# Patient Record
Sex: Female | Born: 1965 | Hispanic: Yes | Marital: Married | Smoking: Former smoker
Health system: Southern US, Community
[De-identification: ages and names within clinical notes are randomized; demographics above are authoritative.]

## PROBLEM LIST (undated history)

## (undated) DIAGNOSIS — N649 Disorder of breast, unspecified: Secondary | ICD-10-CM

## (undated) DIAGNOSIS — K219 Gastro-esophageal reflux disease without esophagitis: Secondary | ICD-10-CM

## (undated) DIAGNOSIS — K759 Inflammatory liver disease, unspecified: Secondary | ICD-10-CM

## (undated) HISTORY — PX: TUBAL LIGATION: SHX77

---

## 2009-06-29 ENCOUNTER — Ambulatory Visit: Payer: Self-pay | Admitting: Family Medicine

## 2018-06-05 ENCOUNTER — Other Ambulatory Visit (HOSPITAL_COMMUNITY): Payer: Self-pay | Admitting: *Deleted

## 2018-06-05 DIAGNOSIS — N644 Mastodynia: Secondary | ICD-10-CM

## 2018-07-14 ENCOUNTER — Ambulatory Visit
Admission: RE | Admit: 2018-07-14 | Discharge: 2018-07-14 | Disposition: A | Discharge status: Home / Self Care | Payer: No Typology Code available for payment source | Source: Ambulatory Visit | Attending: Obstetrics and Gynecology | Admitting: Obstetrics and Gynecology

## 2018-07-14 ENCOUNTER — Other Ambulatory Visit (HOSPITAL_COMMUNITY): Payer: Self-pay | Admitting: Obstetrics and Gynecology

## 2018-07-14 ENCOUNTER — Encounter (HOSPITAL_COMMUNITY): Payer: Self-pay

## 2018-07-14 ENCOUNTER — Ambulatory Visit
Admission: RE | Admit: 2018-07-14 | Discharge: 2018-07-14 | Disposition: A | Discharge status: Home / Self Care | Payer: Self-pay | Source: Ambulatory Visit | Attending: Obstetrics and Gynecology | Admitting: Obstetrics and Gynecology

## 2018-07-14 ENCOUNTER — Ambulatory Visit (HOSPITAL_COMMUNITY)
Admission: RE | Admit: 2018-07-14 | Discharge: 2018-07-14 | Disposition: A | Discharge status: Home / Self Care | Payer: Self-pay | Source: Ambulatory Visit | Attending: Obstetrics and Gynecology | Admitting: Obstetrics and Gynecology

## 2018-07-14 VITALS — BP 122/78 | Wt 164.0 lb

## 2018-07-14 DIAGNOSIS — N644 Mastodynia: Secondary | ICD-10-CM

## 2018-07-14 DIAGNOSIS — N6310 Unspecified lump in the right breast, unspecified quadrant: Secondary | ICD-10-CM

## 2018-07-14 DIAGNOSIS — Z1239 Encounter for other screening for malignant neoplasm of breast: Secondary | ICD-10-CM

## 2018-07-14 NOTE — Progress Notes (Signed)
Complaints of left breast pain since October 2019 that comes and goes. Patient rates the pain at a 7   Pap Smear: Pap smear not completed today. Last Pap smear was on October 2019 at the [facility] Department and normal per patient. Per patient has no history of an abnormal Pap smear. No Pap smear results are in Epic.  Physical exam: Breasts Breasts symmetrical. No skin abnormalities bilateral breasts. No nipple retraction bilateral breasts. No nipple discharge bilateral breasts. No lymphadenopathy. No lumps palpated bilateral breasts. Complaints of left breast pain at 3 o'clock next to areola on exam. Referred patient to the [facility] Center of [facility] for a diagnostic mammogram. Appointment scheduled for Tuesday, July 14, 2018 at 1430.        Pelvic/Bimanual No Pap smear completed today since last Pap smear was in October 2019 per patient. Pap smear not indicated per BCCCP guidelines.   Smoking History: Patient has never smoked.  Patient Navigation: Patient education provided. Access to services provided for patient through [facility] program. Spanish interpreter provided.   Colorectal Cancer Screening: Per patient has never had a colonoscopy completed. No complaints today. FIT Test given to patient to complete and return to BCCCP.  Breast and Cervical Cancer Risk Assessment: Patient has no family history of breast cancer, known genetic mutations, or radiation treatment to the chest before age 5. Patient has no history of cervical dysplasia, immunocompromised, or DES exposure in-utero.  Risk Assessment    Risk Scores      07/14/2018   Last edited by: Lynnell Dike, LPN   5-year risk: 0.5 %   Lifetime risk: 4.5 %         Used Spanish interpreter Natale Lay from [place].

## 2018-07-14 NOTE — Patient Instructions (Signed)
Explained breast self awareness with Chloe Payne. Patient did not need a Pap smear today due to last Pap smear was in October 2019 per patient. Let her know B[facility]CP will cover Pap smears every 3 years unless has a history of abnormal Pap smears. Referred patient to the [facility] for a diagnostic mammogram. Appointment scheduled for Tuesday, July 14, 2018 at 1430. Patient aware of appointment and will be there. [name] verbalized understanding.  Melisia Leming, Kathaleen Maserhristine Poll, RN 4:04 PM

## 2018-07-15 ENCOUNTER — Encounter (HOSPITAL_COMMUNITY): Payer: Self-pay | Admitting: *Deleted

## 2018-07-17 ENCOUNTER — Ambulatory Visit
Admission: RE | Admit: 2018-07-17 | Discharge: 2018-07-17 | Disposition: A | Discharge status: Home / Self Care | Payer: No Typology Code available for payment source | Source: Ambulatory Visit | Attending: Obstetrics and Gynecology | Admitting: Obstetrics and Gynecology

## 2018-07-17 DIAGNOSIS — N6310 Unspecified lump in the right breast, unspecified quadrant: Secondary | ICD-10-CM

## 2018-07-21 ENCOUNTER — Encounter (HOSPITAL_COMMUNITY): Payer: Self-pay | Admitting: *Deleted

## 2018-07-29 ENCOUNTER — Other Ambulatory Visit: Payer: Self-pay | Admitting: General Surgery

## 2018-07-29 ENCOUNTER — Other Ambulatory Visit: Payer: Self-pay

## 2018-07-29 ENCOUNTER — Ambulatory Visit: Payer: Self-pay | Admitting: General Surgery

## 2018-07-29 DIAGNOSIS — N6021 Fibroadenosis of right breast: Secondary | ICD-10-CM

## 2018-08-12 NOTE — Pre-Procedure Instructions (Signed)
Chloe Payne  08/12/2018      [facility] #[id number] Gin[place] Otto,  - [address] AT [facility] & [address] [place] [address] Phone: [phone] Fax: [phone]    Your procedure is scheduled on Wednesday February 26th.  Report to [facility] Admitting at 10:00 A.M.  Call this number if you have problems the morning of surgery:  [phone]   Remember:  Do not eat after midnight. You may drink clear liquids until 9:00 AM. Clear liquids include water, non-citrus juices without pulp, carbonated  beverages, clear tea, black coffee and gatorade.      Take these medicines the morning of surgery with A SIP OF WATER - NONE  7 days prior to surgery STOP taking any Aspirin(unless otherwise instructed by your surgeon), Aleve, Naproxen, Ibuprofen, Motrin, Advil, Goody's, BC's, all herbal medications, fish oil, and all vitamins     Do not wear jewelry, make-up or nail polish.  Do not wear lotions, powders, or perfumes, or deodorant.  Do not shave 48 hours prior to surgery.    Do not bring valuables to the hospital.  [facility] is not responsible for any belongings or valuables.  Contacts, dentures or bridgework may not be worn into surgery.  Leave your suitcase in the car.  After surgery it may be brought to your room.  For patients admitted to the hospital, discharge time will be determined by your treatment team.  Patients discharged the day of surgery will not be allowed to drive home.   [facility]- Preparing For Surgery  Before surgery, you can play an important role. Because skin is not sterile, your skin needs to be as free of germs as possible. You can reduce the number of germs on your skin by washing with CHG (chlorahexidine gluconate) Soap before surgery.  CHG is an antiseptic cleaner which kills germs and bonds with the skin to continue killing germs even after washing.    Oral Hygiene is  also important to reduce your risk of infection.  Remember - BRUSH YOUR TEETH THE MORNING OF SURGERY WITH YOUR REGULAR TOOTHPASTE  Please do not use if you have an allergy to CHG or antibacterial soaps. If your skin becomes reddened/irritated stop using the CHG.  Do not shave (including legs and underarms) for at least 48 hours prior to first CHG shower. It is OK to shave your face.  Please follow these instructions carefully.   1. Shower the NIGHT BEFORE SURGERY and the MORNING OF SURGERY with CHG.   2. If you chose to wash your hair, wash your hair first as usual with your normal shampoo.  3. After you shampoo, rinse your hair and body thoroughly to remove the shampoo.  4. Use CHG as you would any other liquid soap. You can apply CHG directly to the skin and wash gently with a scrungie or a clean washcloth.   5. Apply the CHG Soap to your body ONLY FROM THE NECK DOWN.  Do not use on open wounds or open sores. Avoid contact with your eyes, ears, mouth and genitals (private parts). Wash Face and genitals (private parts)  with your normal soap.  6. Wash thoroughly, paying special attention to the area where your surgery will be performed.  7. Thoroughly rinse your body with warm water from the neck down.  8. DO NOT shower/wash with your normal soap after using and rinsing off the CHG  Soap.  9. Pat yourself dry with a CLEAN TOWEL.  10. Wear CLEAN PAJAMAS to bed the night before surgery, wear comfortable clothes the morning of surgery  11. Place CLEAN SHEETS on your bed the night of your first shower and DO NOT SLEEP WITH PETS.    Day of Surgery: Shower as stated above. Do not apply any deodorants/lotions.  Please wear clean clothes to the hospital/surgery center.   Remember to brush your teeth WITH YOUR REGULAR TOOTHPASTE.  Please read over the following fact sheets that you were given.

## 2018-08-13 ENCOUNTER — Encounter (HOSPITAL_COMMUNITY)
Admission: RE | Admit: 2018-08-13 | Discharge: 2018-08-13 | Disposition: A | Discharge status: Home / Self Care | Payer: No Typology Code available for payment source | Source: Ambulatory Visit | Attending: General Surgery | Admitting: General Surgery

## 2018-08-13 ENCOUNTER — Encounter (HOSPITAL_COMMUNITY): Payer: Self-pay

## 2018-08-13 ENCOUNTER — Other Ambulatory Visit: Payer: Self-pay

## 2018-08-13 DIAGNOSIS — Z01812 Encounter for preprocedural laboratory examination: Secondary | ICD-10-CM | POA: Insufficient documentation

## 2018-08-13 HISTORY — DX: Disorder of breast, unspecified: N64.9

## 2018-08-13 HISTORY — DX: Inflammatory liver disease, unspecified: K75.9

## 2018-08-13 HISTORY — DX: Gastro-esophageal reflux disease without esophagitis: K21.9

## 2018-08-13 LAB — CBC
HCT: 40.7 % (ref 36.0–46.0)
Hemoglobin: 13.7 g/dL (ref 12.0–15.0)
MCH: 29.2 pg (ref 26.0–34.0)
MCHC: 33.7 g/dL (ref 30.0–36.0)
MCV: 86.8 fL (ref 80.0–100.0)
Platelets: 421 10*3/uL — ABNORMAL HIGH (ref 150–400)
RBC: 4.69 MIL/uL (ref 3.87–5.11)
RDW: 12.4 % (ref 11.5–15.5)
WBC: 7 10*3/uL (ref 4.0–10.5)
nRBC: 0.6 % — ABNORMAL HIGH (ref 0.0–0.2)

## 2018-08-13 LAB — FECAL OCCULT BLOOD, IMMUNOCHEMICAL: Fecal Occult Bld: NEGATIVE

## 2018-08-13 NOTE — Progress Notes (Signed)
Pt accompanied by daughters and Spanish Interpreter at [employer] appointment. Pt denies SOB, chest pain, and being under the care of a cardiologist. Pt denies having a stress test, echo and cardiac cath. Pt denies having an EKG and chest x ray. Pt denies recent labs. Pt verbalized understanding of all pre-op instructions.

## 2018-08-17 ENCOUNTER — Ambulatory Visit
Admission: RE | Admit: 2018-08-17 | Discharge: 2018-08-17 | Disposition: A | Discharge status: Home / Self Care | Payer: No Typology Code available for payment source | Source: Ambulatory Visit | Attending: General Surgery | Admitting: General Surgery

## 2018-08-17 DIAGNOSIS — N6021 Fibroadenosis of right breast: Secondary | ICD-10-CM

## 2018-08-19 ENCOUNTER — Ambulatory Visit (HOSPITAL_COMMUNITY)
Admission: RE | Admit: 2018-08-19 | Discharge: 2018-08-19 | Disposition: A | Discharge status: Home / Self Care | Payer: Self-pay | Attending: General Surgery | Admitting: General Surgery

## 2018-08-19 ENCOUNTER — Encounter (HOSPITAL_COMMUNITY)
Admission: RE | Disposition: A | Discharge status: Home / Self Care | Payer: Self-pay | Source: Home / Self Care | Attending: General Surgery

## 2018-08-19 ENCOUNTER — Encounter (HOSPITAL_COMMUNITY): Payer: Self-pay | Admitting: *Deleted

## 2018-08-19 ENCOUNTER — Encounter (HOSPITAL_COMMUNITY): Payer: Self-pay | Admitting: Surgery

## 2018-08-19 ENCOUNTER — Ambulatory Visit (HOSPITAL_COMMUNITY): Payer: Self-pay | Admitting: Certified Registered"

## 2018-08-19 ENCOUNTER — Ambulatory Visit
Admission: RE | Admit: 2018-08-19 | Discharge: 2018-08-19 | Disposition: A | Discharge status: Home / Self Care | Payer: No Typology Code available for payment source | Source: Ambulatory Visit | Attending: General Surgery | Admitting: General Surgery

## 2018-08-19 ENCOUNTER — Other Ambulatory Visit: Payer: Self-pay

## 2018-08-19 DIAGNOSIS — Z87891 Personal history of nicotine dependence: Secondary | ICD-10-CM | POA: Insufficient documentation

## 2018-08-19 DIAGNOSIS — N6021 Fibroadenosis of right breast: Secondary | ICD-10-CM

## 2018-08-19 DIAGNOSIS — Z8249 Family history of ischemic heart disease and other diseases of the circulatory system: Secondary | ICD-10-CM | POA: Insufficient documentation

## 2018-08-19 DIAGNOSIS — N6011 Diffuse cystic mastopathy of right breast: Secondary | ICD-10-CM | POA: Insufficient documentation

## 2018-08-19 HISTORY — PX: BREAST LUMPECTOMY WITH RADIOACTIVE SEED LOCALIZATION: SHX6424

## 2018-08-19 SURGERY — BREAST LUMPECTOMY WITH RADIOACTIVE SEED LOCALIZATION
Anesthesia: General | Site: Breast | Laterality: Right

## 2018-08-19 MED ORDER — PROPOFOL 10 MG/ML IV BOLUS
INTRAVENOUS | Status: AC
  Filled 2018-08-19: qty 20

## 2018-08-19 MED ORDER — MIDAZOLAM HCL 2 MG/2ML IJ SOLN
INTRAMUSCULAR | Status: AC
  Filled 2018-08-19: qty 2

## 2018-08-19 MED ORDER — FENTANYL CITRATE (PF) 100 MCG/2ML IJ SOLN
25.0000 ug | INTRAMUSCULAR | Status: DC | PRN
  Administered 2018-08-19 (×2): 25 ug via INTRAVENOUS

## 2018-08-19 MED ORDER — FENTANYL CITRATE (PF) 250 MCG/5ML IJ SOLN
INTRAMUSCULAR | Status: DC | PRN
  Administered 2018-08-19: 50 ug via INTRAVENOUS
  Administered 2018-08-19: 25 ug via INTRAVENOUS

## 2018-08-19 MED ORDER — CELECOXIB 200 MG PO CAPS
200.0000 mg | ORAL_CAPSULE | ORAL | Status: AC
  Administered 2018-08-19: 200 mg via ORAL
  Filled 2018-08-19: qty 1

## 2018-08-19 MED ORDER — FENTANYL CITRATE (PF) 250 MCG/5ML IJ SOLN
INTRAMUSCULAR | Status: AC
  Filled 2018-08-19: qty 5

## 2018-08-19 MED ORDER — DEXAMETHASONE SODIUM PHOSPHATE 10 MG/ML IJ SOLN
INTRAMUSCULAR | Status: DC | PRN
  Administered 2018-08-19: 10 mg via INTRAVENOUS

## 2018-08-19 MED ORDER — 0.9 % SODIUM CHLORIDE (POUR BTL) OPTIME
TOPICAL | Status: DC | PRN
  Administered 2018-08-19: 1000 mL

## 2018-08-19 MED ORDER — ONDANSETRON HCL 4 MG/2ML IJ SOLN
INTRAMUSCULAR | Status: AC
  Filled 2018-08-19: qty 2

## 2018-08-19 MED ORDER — HYDROCODONE-ACETAMINOPHEN 5-325 MG PO TABS: 1.0000 | ORAL_TABLET | Freq: Four times a day (QID) | ORAL | 0 refills | Status: DC | PRN

## 2018-08-19 MED ORDER — LACTATED RINGERS IV SOLN
INTRAVENOUS | Status: DC
  Administered 2018-08-19: 11:00:00 via INTRAVENOUS

## 2018-08-19 MED ORDER — PHENYLEPHRINE 40 MCG/ML (10ML) SYRINGE FOR IV PUSH (FOR BLOOD PRESSURE SUPPORT)
PREFILLED_SYRINGE | INTRAVENOUS | Status: DC | PRN
  Administered 2018-08-19 (×2): 40 ug via INTRAVENOUS

## 2018-08-19 MED ORDER — BUPIVACAINE-EPINEPHRINE 0.25% -1:200000 IJ SOLN
INTRAMUSCULAR | Status: DC | PRN
  Administered 2018-08-19: 20 mL

## 2018-08-19 MED ORDER — CHLORHEXIDINE GLUCONATE CLOTH 2 % EX PADS: 6.0000 | MEDICATED_PAD | Freq: Once | CUTANEOUS | Status: DC

## 2018-08-19 MED ORDER — PHENYLEPHRINE 40 MCG/ML (10ML) SYRINGE FOR IV PUSH (FOR BLOOD PRESSURE SUPPORT)
PREFILLED_SYRINGE | INTRAVENOUS | Status: AC
  Filled 2018-08-19: qty 10

## 2018-08-19 MED ORDER — DEXAMETHASONE SODIUM PHOSPHATE 10 MG/ML IJ SOLN
INTRAMUSCULAR | Status: AC
  Filled 2018-08-19: qty 1

## 2018-08-19 MED ORDER — FENTANYL CITRATE (PF) 100 MCG/2ML IJ SOLN
INTRAMUSCULAR | Status: AC
  Filled 2018-08-19: qty 2

## 2018-08-19 MED ORDER — ACETAMINOPHEN 500 MG PO TABS
1000.0000 mg | ORAL_TABLET | ORAL | Status: AC
  Administered 2018-08-19: 1000 mg via ORAL
  Filled 2018-08-19: qty 2

## 2018-08-19 MED ORDER — LIDOCAINE 2% (20 MG/ML) 5 ML SYRINGE
INTRAMUSCULAR | Status: DC | PRN
  Administered 2018-08-19: 60 mg via INTRAVENOUS

## 2018-08-19 MED ORDER — CEFAZOLIN SODIUM-DEXTROSE 2-4 GM/100ML-% IV SOLN
2.0000 g | INTRAVENOUS | Status: AC
  Administered 2018-08-19: 2 g via INTRAVENOUS
  Filled 2018-08-19: qty 100

## 2018-08-19 MED ORDER — PROPOFOL 10 MG/ML IV BOLUS
INTRAVENOUS | Status: DC | PRN
  Administered 2018-08-19: 110 mg via INTRAVENOUS
  Administered 2018-08-19: 20 mg via INTRAVENOUS

## 2018-08-19 MED ORDER — MIDAZOLAM HCL 2 MG/2ML IJ SOLN
INTRAMUSCULAR | Status: DC | PRN
  Administered 2018-08-19: 2 mg via INTRAVENOUS

## 2018-08-19 MED ORDER — BUPIVACAINE HCL (PF) 0.25 % IJ SOLN
INTRAMUSCULAR | Status: AC
  Filled 2018-08-19: qty 30

## 2018-08-19 MED ORDER — GABAPENTIN 300 MG PO CAPS
300.0000 mg | ORAL_CAPSULE | ORAL | Status: AC
  Administered 2018-08-19: 300 mg via ORAL
  Filled 2018-08-19: qty 1

## 2018-08-19 SURGICAL SUPPLY — 31 items
APPLIER CLIP 9.375 MED OPEN (MISCELLANEOUS)
BINDER BREAST LRG (GAUZE/BANDAGES/DRESSINGS) IMPLANT
BINDER BREAST XLRG (GAUZE/BANDAGES/DRESSINGS) IMPLANT
CANISTER SUCT 3000ML PPV (MISCELLANEOUS) ×3 IMPLANT
CHLORAPREP W/TINT 26ML (MISCELLANEOUS) ×3 IMPLANT
CLIP APPLIE 9.375 MED OPEN (MISCELLANEOUS) IMPLANT
COVER PROBE W GEL 5X96 (DRAPES) ×3 IMPLANT
COVER SURGICAL LIGHT HANDLE (MISCELLANEOUS) ×3 IMPLANT
COVER WAND RF STERILE (DRAPES) IMPLANT
DERMABOND ADVANCED (GAUZE/BANDAGES/DRESSINGS) ×2
DERMABOND ADVANCED .7 DNX12 (GAUZE/BANDAGES/DRESSINGS) ×1 IMPLANT
DEVICE DUBIN SPECIMEN MAMMOGRA (MISCELLANEOUS) ×3 IMPLANT
DRAPE CHEST BREAST 15X10 FENES (DRAPES) ×3 IMPLANT
ELECT COATED BLADE 2.86 ST (ELECTRODE) ×3 IMPLANT
ELECT REM PT RETURN 9FT ADLT (ELECTROSURGICAL) ×3
ELECTRODE REM PT RTRN 9FT ADLT (ELECTROSURGICAL) ×1 IMPLANT
GLOVE BIO SURGEON STRL SZ7.5 (GLOVE) ×6 IMPLANT
GOWN STRL REUS W/ TWL LRG LVL3 (GOWN DISPOSABLE) ×2 IMPLANT
GOWN STRL REUS W/TWL LRG LVL3 (GOWN DISPOSABLE) ×4
KIT BASIN OR (CUSTOM PROCEDURE TRAY) ×3 IMPLANT
KIT MARKER MARGIN INK (KITS) ×3 IMPLANT
LIGHT WAVEGUIDE WIDE FLAT (MISCELLANEOUS) IMPLANT
NEEDLE HYPO 25GX1X1/2 BEV (NEEDLE) ×3 IMPLANT
NS IRRIG 1000ML POUR BTL (IV SOLUTION) ×3 IMPLANT
PACK GENERAL/GYN (CUSTOM PROCEDURE TRAY) ×3 IMPLANT
SUT MNCRL AB 4-0 PS2 18 (SUTURE) ×3 IMPLANT
SUT SILK 2 0 SH (SUTURE) IMPLANT
SUT VIC AB 3-0 SH 18 (SUTURE) ×3 IMPLANT
SYR CONTROL 10ML LL (SYRINGE) ×3 IMPLANT
TOWEL OR 17X24 6PK STRL BLUE (TOWEL DISPOSABLE) ×3 IMPLANT
TOWEL OR 17X26 10 PK STRL BLUE (TOWEL DISPOSABLE) ×3 IMPLANT

## 2018-08-19 NOTE — Anesthesia Preprocedure Evaluation (Addendum)
Anesthesia Evaluation  Patient identified by MRN, date of birth, ID band Patient awake    Reviewed: Allergy & Precautions, NPO status , Patient's Chart, lab work & pertinent test results  Airway Mallampati: I  TM Distance: >3 FB Neck ROM: Full    Dental no notable dental hx. (+) Teeth Intact, Dental Advisory Given   Pulmonary neg pulmonary ROS, former smoker,    Pulmonary exam normal breath sounds clear to auscultation       Cardiovascular negative cardio ROS Normal cardiovascular exam Rhythm:Regular Rate:Normal     Neuro/Psych negative neurological ROS  negative psych ROS   GI/Hepatic Neg liver ROS,   Endo/Other  negative endocrine ROS  Renal/GU negative Renal ROS  negative genitourinary   Musculoskeletal negative musculoskeletal ROS (+)   Abdominal   Peds  Hematology negative hematology ROS (+)   Anesthesia Other Findings Complex sclerosing lesion of right breast  Reproductive/Obstetrics negative OB ROS                            Anesthesia Physical Anesthesia Plan  ASA: II  Anesthesia Plan: General   Post-op Pain Management:    Induction: Intravenous  PONV Risk Score and Plan: 3 and Dexamethasone, Ondansetron and Midazolam  Airway Management Planned: Oral ETT and LMA  Additional Equipment:   Intra-op Plan:   Post-operative Plan: Extubation in OR  Informed Consent: I have reviewed the patients History and Physical, chart, labs and discussed the procedure including the risks, benefits and alternatives for the proposed anesthesia with the patient or authorized representative who has indicated his/her understanding and acceptance.     Dental advisory given  Plan Discussed with: CRNA  Anesthesia Plan Comments:        Anesthesia Quick Evaluation

## 2018-08-19 NOTE — Interval H&P Note (Signed)
History and Physical Interval Note:  08/19/2018 11:10 AM  Chloe Payne  has presented today for surgery, with the diagnosis of RIGHT BREAST COMPLEX SCLEROSING LESION  The various methods of treatment have been discussed with the patient and family. After consideration of risks, benefits and other options for treatment, the patient has consented to  Procedure(s): RIGHT BREAST LUMPECTOMY WITH RADIOACTIVE SEED LOCALIZATION (Right) as a surgical intervention .  The patient's history has been reviewed, patient examined, no change in status, stable for surgery.  I have reviewed the patient's chart and labs.  Questions were answered to the patient's satisfaction.     Chevis Pretty III

## 2018-08-19 NOTE — Transfer of Care (Signed)
Immediate Anesthesia Transfer of Care Note  Patient: [place] Wentzell  Procedure(s) Performed: RIGHT BREAST LUMPECTOMY WITH RADIOACTIVE SEED LOCALIZATION (Right Breast)  Patient Location: PACU  Anesthesia Type:General  Level of Consciousness: awake, alert  and oriented  Airway & Oxygen Therapy: Patient Spontanous Breathing  Post-op Assessment: Report given to RN  Post vital signs: Reviewed and stable  Last Vitals:  Vitals Value Taken Time  BP    Temp 36.5 C 08/19/2018 12:36 PM  Pulse    Resp 24 08/19/2018 12:35 PM  SpO2    Vitals shown include unvalidated device data.  Last Pain:  Vitals:   08/19/18 1036  TempSrc:   PainSc: 0-No pain      Patients Stated Pain Goal: 3 (08/19/18 1036)  Complications: No apparent anesthesia complications

## 2018-08-19 NOTE — Op Note (Signed)
08/19/2018  12:27 PM  PATIENT:  Chloe Payne  53 y.o. female  PRE-OPERATIVE DIAGNOSIS:  RIGHT BREAST COMPLEX SCLEROSING LESION  POST-OPERATIVE DIAGNOSIS:  RIGHT BREAST COMPLEX SCLEROSING LESION  PROCEDURE:  Procedure(s): RIGHT BREAST LUMPECTOMY WITH RADIOACTIVE SEED LOCALIZATION (Right)  SURGEON:  Surgeon(s) and Role:    * Griselda Miner, MD - Primary  PHYSICIAN ASSISTANT:   ASSISTANTS: none   ANESTHESIA:   local and general  EBL:  minimal   BLOOD ADMINISTERED:none  DRAINS: none   LOCAL MEDICATIONS USED:  MARCAINE     SPECIMEN:  Source of Specimen:  right breast tissue  DISPOSITION OF SPECIMEN:  PATHOLOGY  COUNTS:  YES  TOURNIQUET:  * No tourniquets in log *  DICTATION: .Dragon Dictation   After informed consent was obtained the patient was brought to the operating room and placed in the supine position on the operating table.  After adequate induction of general anesthesia the patient's right breast was prepped with ChloraPrep, allowed to dry, and draped in usual sterile manner.  An appropriate timeout was performed.  Previously an I-125 seed was placed in the upper outer aspect of the right breast to mark an area of a complex sclerosing lesion.  The neoprobe was set to I-125 in the area of radioactivity was readily identified.  The area around this was infiltrated with quarter percent Marcaine.  A curvilinear incision was made along the upper outer edge of the breast with a 15 blade knife.  The incision was carried through the skin and subcutaneous tissue sharply with electrocautery.  The dissection was then carried towards the radioactive seed under the direction of the neoprobe.  Once I more closely approached the radioactive seed I then removed a circular portion of breast tissue sharply with the electrocautery around the radioactive seed while checking the area of radioactivity frequently.  Once the specimen was removed it was oriented with the  appropriate paint colors.  A specimen radiograph was obtained that showed the clip and seed to be near the center of the specimen.  The specimen was then sent to pathology for further evaluation.  Hemostasis was achieved using the Bovie electrocautery.  The wound was irrigated with saline and infiltrated with more quarter percent Marcaine.  The deep layer of the wound was then closed with layers of interrupted 3-0 Vicryl stitches.  The skin was then closed with a running 4-0 Monocryl subcuticular stitch.  Dermabond dressings were applied.  The patient tolerated the procedure well.  At the end of the case all needle sponge and instrument counts were correct.  The patient was then awakened and taken recovery in stable condition.  PLAN OF CARE: Discharge to home after PACU  PATIENT DISPOSITION:  PACU - hemodynamically stable.   Delay start of Pharmacological VTE agent (>24hrs) due to surgical blood loss or risk of bleeding: not applicable

## 2018-08-19 NOTE — H&P (Signed)
Chloe Payne  Location: [facility] Surgery Patient #: [id number] DOB: [birth year] Widowed / Language: [place] / Race: White Female   History of Present Illness  The patient is a 53 year old female who presents with a breast mass. We are asked to see the patient in consultation by Dr. Gigi Gin constant to evaluate her for a complex sclerosing lesion of the right breast. The patient is a 53 year old Hispanic female who recently went for a routine screening mammogram. At that time she was found to have an area of distortion in the lateral right breast that measured about 1.8 cm. 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 She has no personal or family history of breast cancer. She is otherwise in good health. She denies any discharge from the nipple. She has had some soreness in the left breast.   Past Surgical History  No pertinent past surgical history   Diagnostic Studies History Colonoscopy  never Mammogram  1-3 years ago  Allergies  No Known Drug Allergies  Allergies Reconciled   Medication History  No Current Medications Medications Reconciled  Social History Caffeine use  Coffee, Tea. No alcohol use  No drug use  Tobacco use  Never smoker.  Family History  Alcohol Abuse  Father. Cancer  Mother. Heart disease in female family member before age 62  Hypertension  Mother. Migraine Headache  Mother. Prostate Cancer  Father.  Pregnancy / Birth History  Age at menarche  14 years. Age of menopause  65-50 Contraceptive History  Oral contraceptives. Gravida  3 Length (months) of breastfeeding  3-6 Maternal age  23-20 Para  3 Regular periods   Other Problems No pertinent past medical history     Review of Systems  General Not Present- Appetite Loss, Chills, Fatigue, Fever, Night Sweats, Weight Gain and Weight Loss. Skin Not Present- Change in Wart/Mole, Dryness, Hives,  Jaundice, New Lesions, Non-Healing Wounds, Rash and Ulcer. HEENT Not Present- Earache, Hearing Loss, Hoarseness, Nose Bleed, Oral Ulcers, Ringing in the Ears, Seasonal Allergies, Sinus Pain, Sore Throat, Visual Disturbances, Wears glasses/contact lenses and Yellow Eyes. Respiratory Not Present- Bloody sputum, Chronic Cough, Difficulty Breathing, Snoring and Wheezing. Breast Present- Breast Pain. Not Present- Breast Mass, Nipple Discharge and Skin Changes. Cardiovascular Not Present- Chest Pain, Difficulty Breathing Lying Down, Leg Cramps, Palpitations, Rapid Heart Rate, Shortness of Breath and Swelling of Extremities. Gastrointestinal Not Present- Abdominal Pain, Bloating, Bloody Stool, Change in Bowel Habits, Chronic diarrhea, Constipation, Difficulty Swallowing, Excessive gas, Gets full quickly at meals, Hemorrhoids, Indigestion, Nausea, Rectal Pain and Vomiting. Female Genitourinary Not Present- Frequency, Nocturia, Painful Urination, Pelvic Pain and Urgency. Musculoskeletal Not Present- Back Pain, Joint Pain, Joint Stiffness, Muscle Pain, Muscle Weakness and Swelling of Extremities. Neurological Not Present- Decreased Memory, Fainting, Headaches, Numbness, Seizures, Tingling, Tremor, Trouble walking and Weakness. Psychiatric Not Present- Anxiety, Bipolar, Change in Sleep Pattern, Depression, Fearful and Frequent crying. Endocrine Not Present- Cold Intolerance, Excessive Hunger, Hair Changes, Heat Intolerance, Hot flashes and New Diabetes. Hematology Not Present- Blood Thinners, Easy Bruising, Excessive bleeding, Gland problems, HIV and Persistent Infections.  Vitals  Weight: 163.13 lb Height: 62in Body Surface Area: 1.75 m Body Mass Index: 29.84 kg/m  Temp.: 98.103F(Oral)  Pulse: 85 (Regular)  BP: 110/72 (Sitting, Left Arm, Standard)       Physical Exam  General Mental Status-Alert. General Appearance-Consistent with stated age. Hydration-Well  hydrated. Voice-Normal.  Head and Neck Head-normocephalic, atraumatic with no lesions or palpable masses. Trachea-midline.  Thyroid Gland Characteristics - normal size and consistency.  Eye Eyeball - Bilateral-Extraocular movements intact. Sclera/Conjunctiva - Bilateral-No scleral icterus.  Chest and Lung Exam Chest and lung exam reveals -quiet, even and easy respiratory effort with no use of accessory muscles and on auscultation, normal breath sounds, no adventitious sounds and normal vocal resonance. Inspection Chest Wall - Normal. Back - normal.  Breast Note: There is no palpable mass in either breast. There is no palpable axillary, supraclavicular, or cervical lymphadenopathy.   Cardiovascular Cardiovascular examination reveals -normal heart sounds, regular rate and rhythm with no murmurs and normal pedal pulses bilaterally.  Abdomen Inspection Inspection of the abdomen reveals - No Hernias. Skin - Scar - no surgical scars. Palpation/Percussion Palpation and Percussion of the abdomen reveal - Soft, Non Tender, No Rebound tenderness, No Rigidity (guarding) and No hepatosplenomegaly. Auscultation Auscultation of the abdomen reveals - Bowel sounds normal.  Neurologic Neurologic evaluation reveals -alert and oriented x 3 with no impairment of recent or remote memory. Mental Status-Normal.  Musculoskeletal Normal Exam - Left-Upper Extremity Strength Normal and Lower Extremity Strength Normal. Normal Exam - Right-Upper Extremity Strength Normal and Lower Extremity Strength Normal.  Lymphatic Head & Neck  General Head & Neck Lymphatics: Bilateral - Description - Normal. Axillary  General Axillary Region: Bilateral - Description - Normal. Tenderness - Non Tender. Femoral & Inguinal  Generalized Femoral & Inguinal Lymphatics: Bilateral - Description - Normal. Tenderness - Non Tender.    Assessment & Plan SCLEROSING ADENOSIS OF BREAST, RIGHT  (N60.21) Impression: The patient appears to have an area of complex sclerosing lesion in the lateral right breast that measures over 1 cm. Because of the appearance of the distortion in the size of the area involved there is probably about a 5-10% risk of missing something more significant there. Because of this I think it would be reasonable to remove this area. I have discussed with her in detail the risks and benefits of the operation as well as some of the technical aspects and she understands and wishes to proceed. I will plan for a right breast radioactive seed localized lumpectomy Current Plans Pt Education - Breast Diseases: discussed with patient and provided information.

## 2018-08-19 NOTE — Progress Notes (Signed)
Letter mailed to patient with negative Fit Test results.  

## 2018-08-19 NOTE — Anesthesia Procedure Notes (Addendum)
Procedure Name: LMA Insertion Date/Time: 08/19/2018 11:50 AM Performed by: Nils Pyle, CRNA Pre-anesthesia Checklist: Patient identified, Emergency Drugs available, Suction available and Patient being monitored Patient Re-evaluated:Patient Re-evaluated prior to induction Oxygen Delivery Method: Circle System Utilized Preoxygenation: Pre-oxygenation with 100% oxygen Induction Type: IV induction Ventilation: Mask ventilation without difficulty LMA: LMA inserted LMA Size: 4.0 Number of attempts: 1 Placement Confirmation: positive ETCO2 Tube secured with: Tape Dental Injury: Teeth and Oropharynx as per pre-operative assessment

## 2018-08-20 ENCOUNTER — Encounter (HOSPITAL_COMMUNITY): Payer: Self-pay | Admitting: General Surgery

## 2018-08-20 NOTE — Anesthesia Postprocedure Evaluation (Signed)
Anesthesia Post Note  Patient: Chloe Payne  Procedure(s) Performed: RIGHT BREAST LUMPECTOMY WITH RADIOACTIVE SEED LOCALIZATION (Right Breast)     Patient location during evaluation: PACU Anesthesia Type: General Level of consciousness: awake and alert Pain management: pain level controlled Vital Signs Assessment: post-procedure vital signs reviewed and stable Respiratory status: spontaneous breathing, nonlabored ventilation, respiratory function stable and patient connected to nasal cannula oxygen Cardiovascular status: blood pressure returned to baseline and stable Postop Assessment: no apparent nausea or vomiting Anesthetic complications: no    Last Vitals:  Vitals:   08/19/18 1321 08/19/18 1324  BP: 113/77   Pulse: 60 70  Resp: 19 16  Temp:  (!) 36.2 C  SpO2: 97% 98%    Last Pain:  Vitals:   08/19/18 1236  TempSrc:   PainSc: Asleep                 Chelsey L Woodrum

## 2018-09-08 ENCOUNTER — Encounter: Payer: Self-pay | Admitting: *Deleted

## 2020-03-11 IMAGING — MG MM PLC BREAST LOC DEV 1ST LESION INC*R*
6 series · 6 of 6 positions shown · non-contrast
Comparison: Previous exam(s).

CLINICAL DATA: 52-year-old female for radioactive seed localization
of RIGHT breast complex sclerosing lesion and papilloma prior to
excision.

EXAM:
MAMMOGRAPHIC GUIDED RADIOACTIVE SEED LOCALIZATION OF THE RIGHT
BREAST

[R CC (1 of 5)]
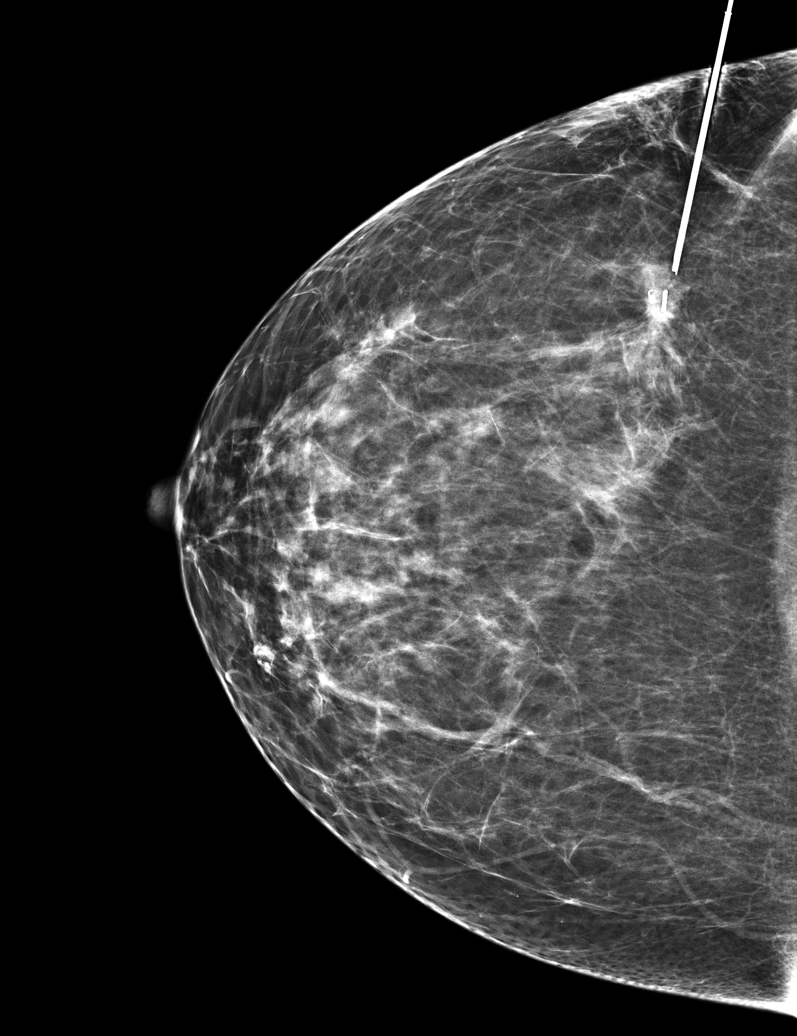

[R CC (2 of 5)]
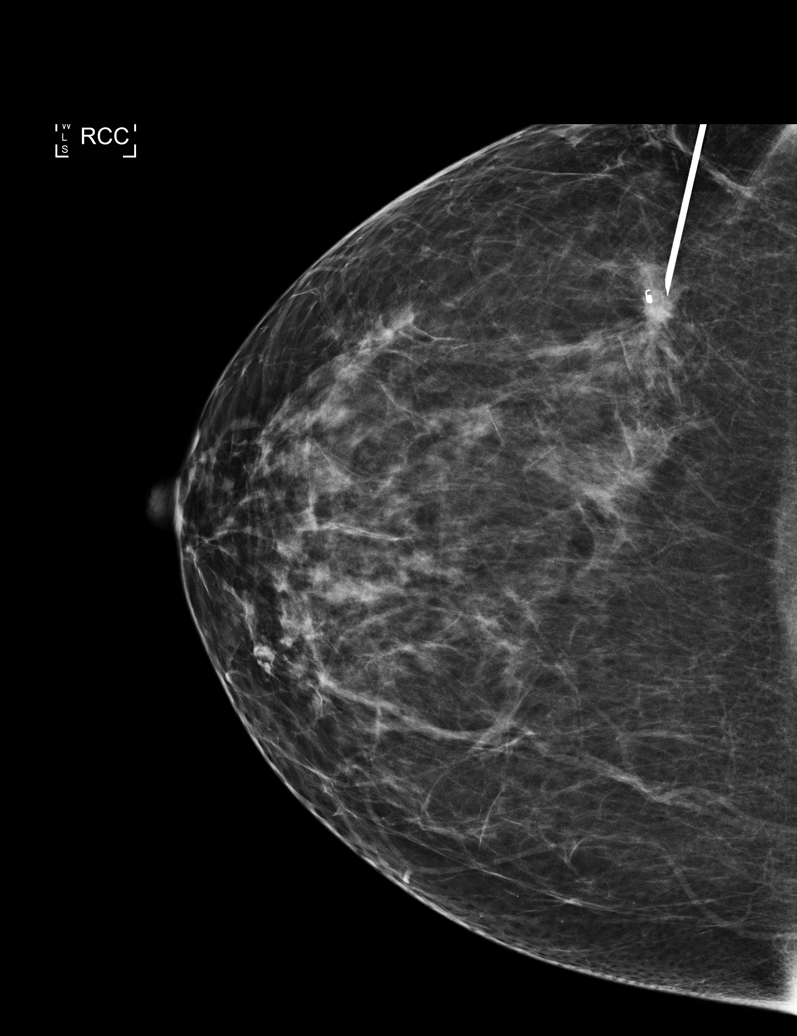

[R CC (3 of 5)]
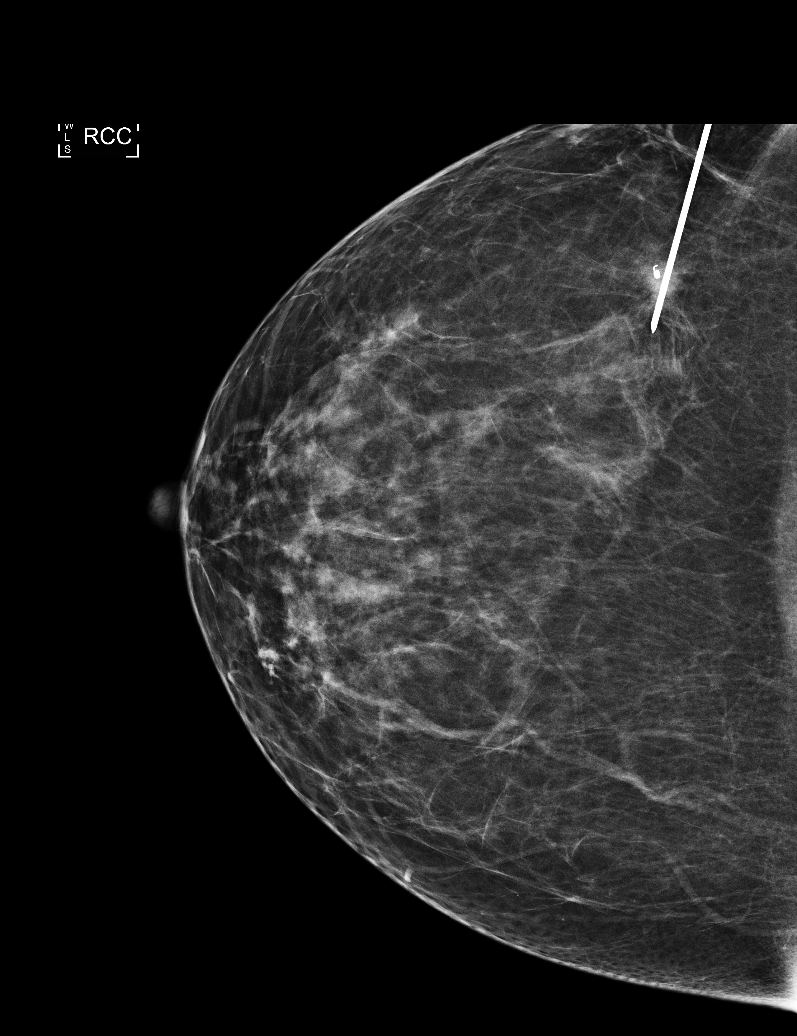

[R CC (4 of 5)]
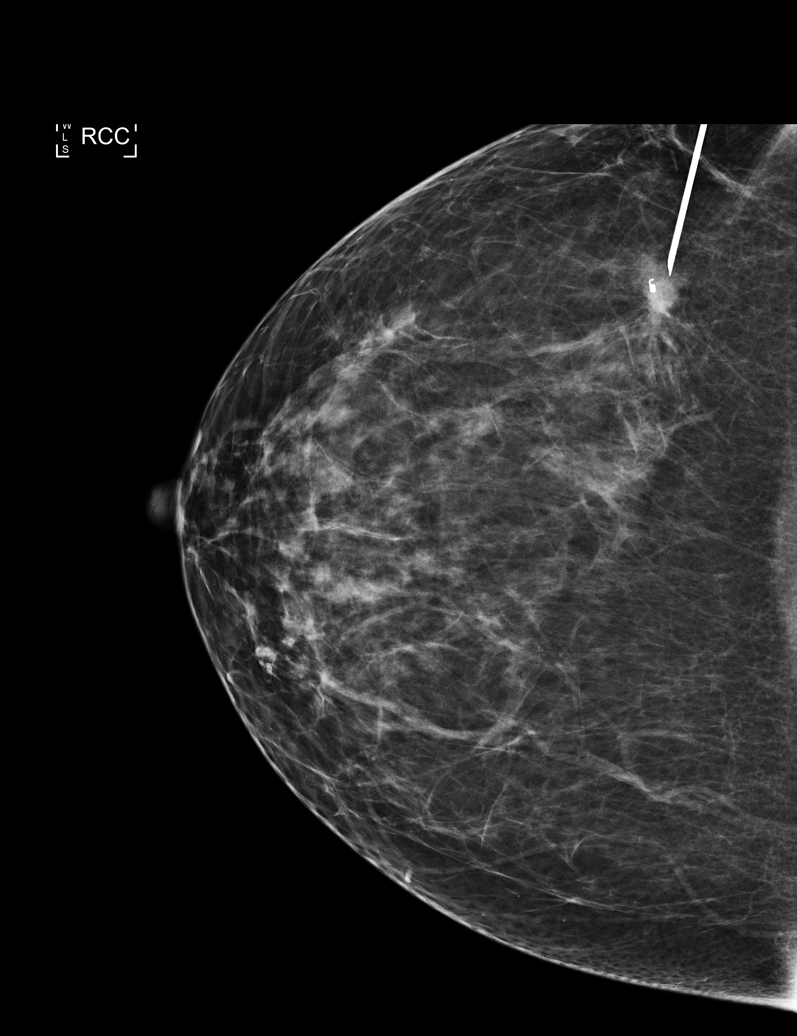

[R LM]
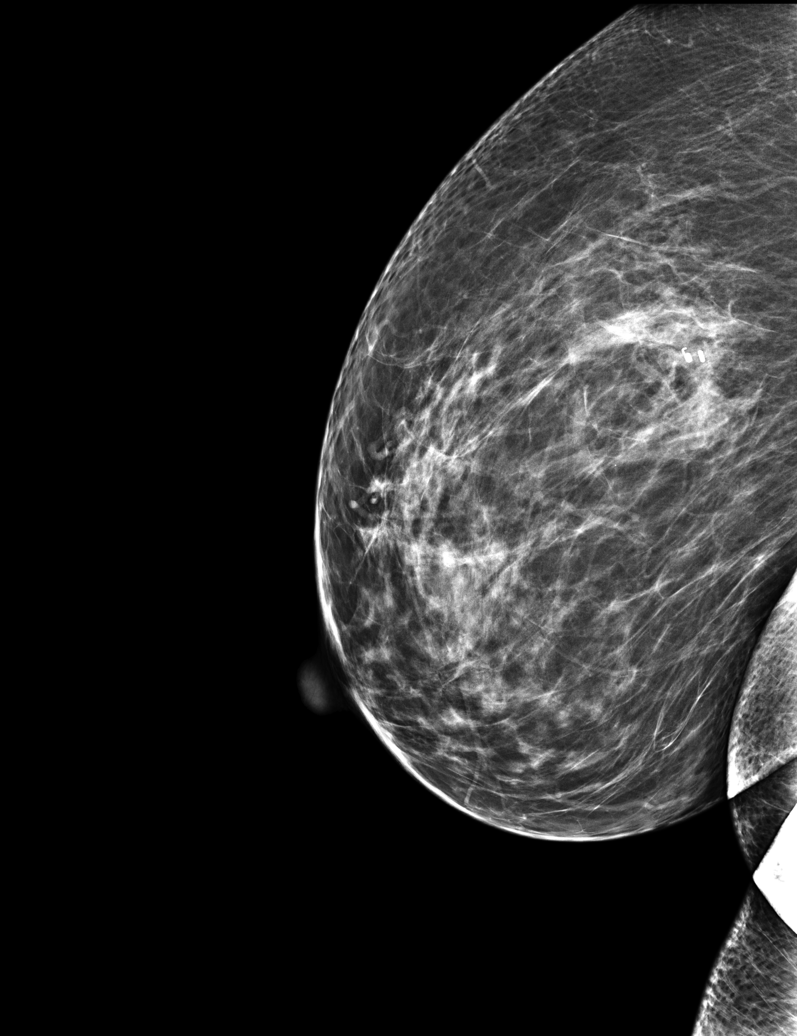

[R CC (5 of 5)]
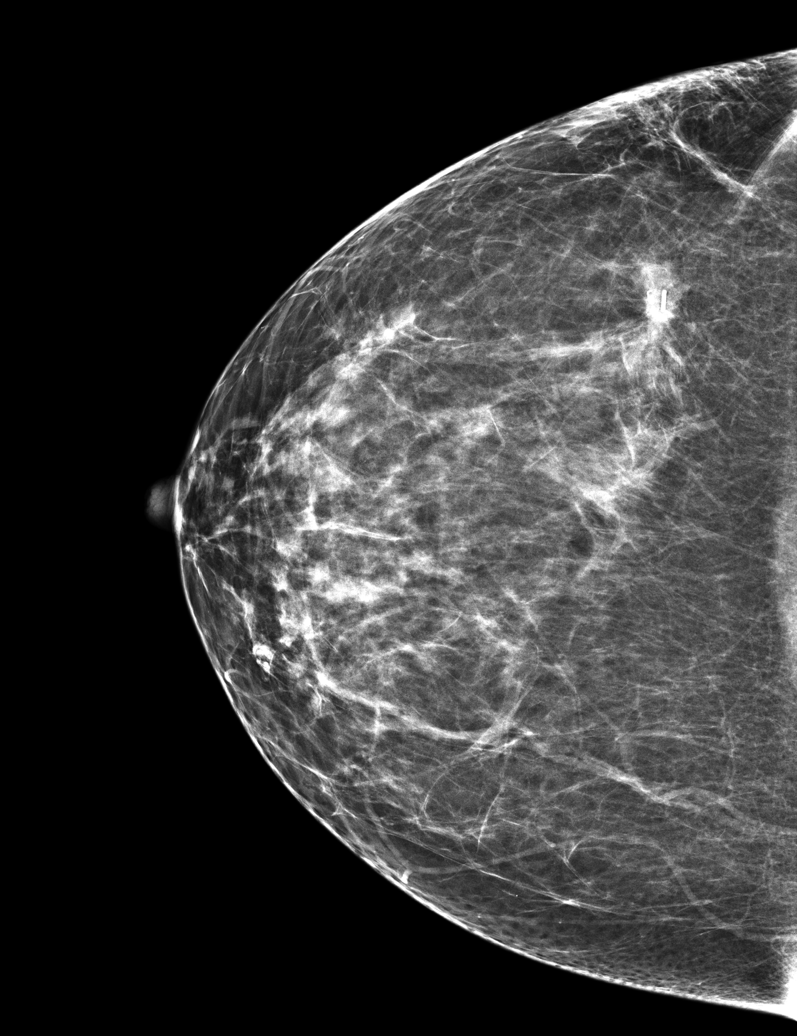

[6 of 6 positions shown; findings below may reference images not displayed]

FINDINGS: Patient presents for radioactive seed localization prior to RIGHT
breast excision. I met with the patient and we discussed the
procedure of seed localization including benefits and alternatives.
We discussed the high likelihood of a successful procedure. We
discussed the risks of the procedure including infection, bleeding,
tissue injury and further surgery. We discussed the low dose of
radioactivity involved in the procedure. Informed, written consent
was given.

The usual time-out protocol was performed immediately prior to the
procedure.

Using mammographic guidance, sterile technique, 1% lidocaine and an
[id number] radioactive seed, the COIL clip was localized using a LATERAL
approach. The follow-up mammogram images confirm the seed in the
expected location and were marked for Dr. Bexhi.

Follow-up survey of the patient confirms presence of the radioactive
seed.

Order number of [id number] seed:  [id number].

Total activity:  0.249 millicuries.  Reference Date: 07/27/2018

The patient tolerated the procedure well and was released from the
[REDACTED]. She was given instructions regarding seed removal.
IMPRESSION: Radioactive seed localization RIGHT breast. No apparent
complications.

## 2020-07-25 ENCOUNTER — Telehealth (INDEPENDENT_AMBULATORY_CARE_PROVIDER_SITE_OTHER): Payer: Self-pay | Admitting: Family Medicine

## 2020-07-25 ENCOUNTER — Encounter: Payer: Self-pay | Admitting: Family Medicine

## 2020-07-25 ENCOUNTER — Other Ambulatory Visit: Payer: Self-pay

## 2020-07-25 NOTE — Patient Instructions (Signed)
° ° ° °  If you have lab work done today you will be contacted with your lab results within the next 2 weeks.  If you have not heard from us then please contact us. The fastest way to get your results is to register for My Chart. ° ° °IF you received an x-ray today, you will receive an invoice from [facility]. Please contact  Radiology at [phone] with questions or concerns regarding your invoice.  ° °IF you received labwork today, you will receive an invoice from [facility]. Please contact [facility] at [phone] with questions or concerns regarding your invoice.  ° °Our billing staff will not be able to assist you with questions regarding bills from these companies. ° °You will be contacted with the lab results as soon as they are available. The fastest way to get your results is to activate your My Chart account. Instructions are located on the last page of this paperwork. If you have not heard from us regarding the results in 2 weeks, please contact this office. °  ° ° ° °

## 2020-07-26 NOTE — Progress Notes (Signed)
Error
